# Patient Record
Sex: Male | Born: 2000 | Race: White | Hispanic: No | Smoking: Never smoker
Health system: Southern US, Community
[De-identification: ages and names within clinical notes are randomized; demographics above are authoritative.]

---
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
## 2021-11-05 HISTORY — PX: WISDOM TOOTH EXTRACTION: SHX21

## 2021-12-01 ENCOUNTER — Other Ambulatory Visit: Payer: Self-pay

## 2021-12-01 ENCOUNTER — Encounter (HOSPITAL_BASED_OUTPATIENT_CLINIC_OR_DEPARTMENT_OTHER): Payer: Self-pay

## 2021-12-01 ENCOUNTER — Other Ambulatory Visit (HOSPITAL_BASED_OUTPATIENT_CLINIC_OR_DEPARTMENT_OTHER): Payer: Self-pay

## 2021-12-01 ENCOUNTER — Emergency Department (HOSPITAL_BASED_OUTPATIENT_CLINIC_OR_DEPARTMENT_OTHER)
Admission: EM | Admit: 2021-12-01 | Discharge: 2021-12-01 | Disposition: A | Discharge status: Home / Self Care | Payer: Commercial Managed Care - PPO | Attending: Emergency Medicine | Admitting: Emergency Medicine

## 2021-12-01 DIAGNOSIS — M79662 Pain in left lower leg: Secondary | ICD-10-CM | POA: Diagnosis not present

## 2021-12-01 DIAGNOSIS — M79605 Pain in left leg: Secondary | ICD-10-CM

## 2021-12-01 DIAGNOSIS — Y92410 Unspecified street and highway as the place of occurrence of the external cause: Secondary | ICD-10-CM | POA: Diagnosis not present

## 2021-12-01 DIAGNOSIS — M542 Cervicalgia: Secondary | ICD-10-CM

## 2021-12-01 MED ORDER — METHOCARBAMOL 500 MG PO TABS
500.0000 mg | ORAL_TABLET | Freq: Three times a day (TID) | ORAL | 0 refills | Status: DC | PRN
  Filled 2021-12-01: qty 20, 7d supply, fill #0

## 2021-12-01 MED ORDER — LIDOCAINE 5 % EX PTCH
1.0000 | MEDICATED_PATCH | CUTANEOUS | 0 refills | Status: DC
  Filled 2021-12-01: qty 30, 30d supply, fill #0
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
## 2022-05-07 ENCOUNTER — Ambulatory Visit: Payer: Self-pay | Admitting: *Deleted

## 2022-05-07 NOTE — Telephone Encounter (Signed)
  Chief Complaint: cough- productive Symptoms: cough, wheezing with deep inhale Frequency: 6 days Pertinent Negatives: Patient denies runny nose, wheezing, chest pain Disposition: [] ED /[x] Urgent Care (no appt availability in office) / [] Appointment(In office/virtual)/ []  [facility]/ [] Home Care/ [] Refused Recommended Disposition /[] [facility]/ []  Follow-up with PCP Additional Notes: Recent travel- out of country, patient reports negative COVID test. No PCP- advised UC for acute symptoms- encouraged establish care

## 2022-05-07 NOTE — Telephone Encounter (Signed)
Summary: cough   The patient has experienced a cough for roughly a week   The patient was previously taking cough drops but shares that they have become ineffective   The patient was calling [facility] to schedule a same day appointment but is not currently a patient   The patient would like to speak with a member of staff further when possible      Reason for Disposition  Wheezing is present  Answer Assessment - Initial Assessment Questions 1. ONSET: "When did the cough begin?"      6 days 2. SEVERITY: "How bad is the cough today?"      wheezing 3. SPUTUM: "Describe the color of your sputum" (none, dry cough; clear, white, yellow, green)     Thick- clear/mucus 4. HEMOPTYSIS: "Are you coughing up any blood?" If so ask: "How much?" (flecks, streaks, tablespoons, etc.)     no 5. DIFFICULTY BREATHING: "Are you having difficulty breathing?" If Yes, ask: "How bad is it?" (e.g., mild, moderate, severe)    - MILD: No SOB at rest, mild SOB with walking, speaks normally in sentences, can lie down, no retractions, pulse < 100.    - MODERATE: SOB at rest, SOB with minimal exertion and prefers to sit, cannot lie down flat, speaks in phrases, mild retractions, audible wheezing, pulse 100-120.    - SEVERE: Very SOB at rest, speaks in single words, struggling to breathe, sitting hunched forward, retractions, pulse > 120      No- wheezing when breathing out 6. FEVER: "Do you have a fever?" If Yes, ask: "What is your temperature, how was it measured, and when did it start?"     no 7. CARDIAC HISTORY: "Do you have any history of heart disease?" (e.g., heart attack, congestive heart failure)      no 8. LUNG HISTORY: "Do you have any history of lung disease?"  (e.g., pulmonary embolus, asthma, emphysema)     no 9. PE RISK FACTORS: "Do you have a history of blood clots?" (or: recent major surgery, recent prolonged travel, bedridden)     no 10. OTHER SYMPTOMS: "Do you have any other symptoms?" (e.g., runny  nose, wheezing, chest pain)       wheezing 11. PREGNANCY: "Is there any chance you are pregnant?" "When was your last menstrual period?"         12. TRAVEL: "Have you traveled out of the country in the last month?" (e.g., travel history, exposures)       Recent travel  Protocols used: Cough - Acute Productive-A-AH

## 2023-01-26 ENCOUNTER — Encounter: Payer: Self-pay | Admitting: Family Medicine

## 2023-01-26 ENCOUNTER — Ambulatory Visit (INDEPENDENT_AMBULATORY_CARE_PROVIDER_SITE_OTHER): Payer: Commercial Managed Care - PPO | Admitting: Family Medicine

## 2023-01-26 VITALS — BP 124/78 | HR 75 | Temp 97.8°F | Ht 65.5 in | Wt 185.0 lb

## 2023-01-26 DIAGNOSIS — E669 Obesity, unspecified: Secondary | ICD-10-CM

## 2023-01-26 DIAGNOSIS — F419 Anxiety disorder, unspecified: Secondary | ICD-10-CM | POA: Diagnosis not present

## 2023-01-26 MED ORDER — ESCITALOPRAM OXALATE 10 MG PO TABS: ORAL_TABLET | ORAL | 0 refills | Status: DC

## 2023-01-26 NOTE — Patient Instructions (Signed)
Start escitalopram as prescribed.

## 2023-01-26 NOTE — Progress Notes (Unsigned)
Assessment/Plan:   Problem List Items Addressed This Visit   None Visit Diagnoses     Anxiety    -  Primary   Relevant Medications   escitalopram (LEXAPRO) 10 MG tablet   Class 1 obesity without serious comorbidity in adult, unspecified BMI, unspecified obesity type           Medications Discontinued During This Encounter  Medication Reason   lidocaine (LIDODERM) 5 %    methocarbamol (ROBAXIN) 500 MG tablet     Return in about 4 weeks (around 02/23/2023) for physical (fasting labs), anxiety.    Subjective:   Encounter date: 01/26/2023  Frank Yang is a 22 y.o. male who does not have a problem list on file.Marland Kitchen   He  has no past medical history on file.Marland Kitchen   He presents with chief complaint of Establish Care (Anxiety concerns) .   HPI:   Anxiety: Patient complains of {anxdep:15361}.  He has the following symptoms: {anxiety sx:15334}. Onset of symptoms was approximately { 0-10:33138} {time units:11} ago, {clinical course - history:17::"unchanged"} since that time. He denies current suicidal and homicidal ideation. Family history significant for {fam hx:15335}.Possible organic causes contributing are: {possible organic causes:15339}. Risk factors: {depression risk factors:1001} Previous treatment includes {anxiety treatments:15336} and {depression treatment:1010}.  He complains of the following side effects from the treatment: {side effects:15372}.   Review of Systems  Constitutional:  Negative for chills, diaphoresis, fever, malaise/fatigue and weight loss.  HENT:  Negative for congestion, ear discharge, ear pain and hearing loss.   Eyes:  Negative for blurred vision, double vision, photophobia, pain, discharge and redness.  Respiratory:  Negative for cough, sputum production, shortness of breath and wheezing.   Cardiovascular:  Negative for chest pain and palpitations.  Gastrointestinal:  Negative for abdominal pain, blood in stool, constipation, diarrhea, heartburn, melena,  nausea and vomiting.  Genitourinary:  Negative for dysuria, flank pain, frequency, hematuria and urgency.  Musculoskeletal:  Negative for myalgias.  Skin:  Negative for itching and rash.  Neurological:  Negative for dizziness, tingling, tremors, speech change, seizures, loss of consciousness, weakness and headaches.  Psychiatric/Behavioral:  Negative for depression, hallucinations, memory loss and suicidal ideas. The patient is nervous/anxious. The patient does not have insomnia.      Past Surgical History:  Procedure Laterality Date   WISDOM TOOTH EXTRACTION Bilateral 11/2021    Outpatient Medications Prior to Visit  Medication Sig Dispense Refill   Acetaminophen (TYLENOL PO) Take by mouth.     lidocaine (LIDODERM) 5 % Place 1 patch onto the skin daily. Remove & Discard patch within 12 hours or as directed by MD (Patient not taking: Reported on 01/26/2023) 30 patch 0   methocarbamol (ROBAXIN) 500 MG tablet Take 1 tablet (500 mg total) by mouth every 8 (eight) hours as needed for muscle spasms. (Patient not taking: Reported on 01/26/2023) 20 tablet 0   No facility-administered medications prior to visit.    Family History  Problem Relation Age of Onset   Anxiety disorder Mother    Gestational diabetes Mother    Depression Father    Anxiety disorder Father    Hypertension Father    Diabetes Father    Alcoholism Father    Diabetes Maternal Grandfather    Anxiety disorder Paternal Grandmother    Heart disease Paternal Grandmother    Diabetes Paternal Grandmother     Social History   Socioeconomic History   Marital status: Significant Other    Spouse name: Not on file   Number  of children: Not on file   Years of education: Not on file   Highest education level: Not on file  Occupational History   Occupation: Financial controller  Tobacco Use   Smoking status: Never    Passive exposure: Never   Smokeless tobacco: Never  Vaping Use   Vaping Use: Never used  Substance  and Sexual Activity   Alcohol use: Yes    Alcohol/week: 4.0 standard drinks of alcohol    Types: 4 Cans of beer per week   Drug use: Not Currently    Types: Cocaine, Marijuana, Psilocybin    Comment: LSD   Sexual activity: Not on file  Other Topics Concern   Not on file  Social History Narrative   Not on file   Social Determinants of Health   Financial Resource Strain: Not on file  Food Insecurity: Not on file  Transportation Needs: Not on file  Physical Activity: Not on file  Stress: Not on file  Social Connections: Not on file  Intimate Partner Violence: Not on file                                                                                                  Objective:  Physical Exam: BP 124/78 (BP Location: Left Arm, Patient Position: Sitting, Cuff Size: Large)   Pulse 75   Temp 97.8 F (36.6 C) (Temporal)   Ht 5' 5.5" (1.664 m)   Wt 185 lb (83.9 kg)   SpO2 99%   BMI 30.32 kg/m     Physical Exam Constitutional:      Appearance: Normal appearance.  HENT:     Head: Normocephalic and atraumatic.     Right Ear: Hearing normal.     Left Ear: Hearing normal.     Nose: Nose normal.  Eyes:     General: No scleral icterus.       Right eye: No discharge.        Left eye: No discharge.     Extraocular Movements: Extraocular movements intact.  Cardiovascular:     Rate and Rhythm: Normal rate and regular rhythm.     Heart sounds: Normal heart sounds.  Pulmonary:     Effort: Pulmonary effort is normal.     Breath sounds: Normal breath sounds.  Abdominal:     Palpations: Abdomen is soft.     Tenderness: There is no abdominal tenderness.  Skin:    General: Skin is warm.     Findings: No rash.  Neurological:     General: No focal deficit present.     Mental Status: He is alert.     Cranial Nerves: No cranial nerve deficit.  Psychiatric:        Mood and Affect: Mood normal.        Behavior: Behavior normal.        Thought Content: Thought content normal.         Judgment: Judgment normal.     No results found.  No results found for this or any previous visit (from the past 2160 hour(s)).  Alesia Banda, MD, MS

## 2023-01-28 ENCOUNTER — Encounter: Payer: Self-pay | Admitting: Family Medicine

## 2023-01-28 DIAGNOSIS — E66811 Obesity, class 1: Secondary | ICD-10-CM | POA: Insufficient documentation

## 2023-01-28 DIAGNOSIS — E669 Obesity, unspecified: Secondary | ICD-10-CM | POA: Insufficient documentation

## 2023-01-28 DIAGNOSIS — F419 Anxiety disorder, unspecified: Secondary | ICD-10-CM | POA: Insufficient documentation

## 2023-01-28 NOTE — Assessment & Plan Note (Signed)
The patient presents with anxiety that he reports has been ongoing for the last 3 years. Family history is positive for anxiety.   Plan:  Start escitalopram (LEXAPRO) 5 mg tablet to be taken orally once daily for two weeks, then increase to 10 mg if well tolerated. Follow-up visit in 4 weeks for evaluation of anxiety symptoms, effectiveness of medication, and physical examination with fasting labs. Emphasize the benefits of continued engagement with regular exercise and healthy lifestyle habits.

## 2023-02-22 ENCOUNTER — Other Ambulatory Visit: Payer: Self-pay | Admitting: Family Medicine

## 2023-02-22 DIAGNOSIS — F419 Anxiety disorder, unspecified: Secondary | ICD-10-CM

## 2023-02-23 ENCOUNTER — Telehealth: Payer: Self-pay | Admitting: Family Medicine

## 2023-02-23 DIAGNOSIS — F419 Anxiety disorder, unspecified: Secondary | ICD-10-CM

## 2023-02-23 NOTE — Telephone Encounter (Signed)
Prescription Request  02/23/2023  LOV: 01/26/2023  What is the name of the medication or equipment? escitalopram (LEXAPRO) 10 MG tablet [[id number]]   Have you contacted your pharmacy to request a refill? No    [facility] [address] [place], [place] - [address] AT [facility] [address] [place]. [place] [address] Phone: [phone] Fax: [phone]    Patient notified that their request is being sent to the clinical staff for review and that they should receive a response within 2 business days.   Please advise at Mobile [phone] (mobile) pt said he just need enough until his next appointment

## 2023-02-24 MED ORDER — ESCITALOPRAM OXALATE 10 MG PO TABS
ORAL_TABLET | ORAL | 0 refills | Status: DC
Start: 2023-02-24 — End: 2023-03-17

## 2023-02-24 NOTE — Telephone Encounter (Signed)
Left patient a detailed voice message regarding refill at [facility] and to call back with any concerns.

## 2023-02-24 NOTE — Telephone Encounter (Signed)
Chart supports rx. Last OV: 01/26/2023 Next OV: 03/17/2023

## 2023-02-25 ENCOUNTER — Encounter: Payer: Commercial Managed Care - PPO | Admitting: Family Medicine

## 2023-03-17 ENCOUNTER — Encounter: Payer: Self-pay | Admitting: Family Medicine

## 2023-03-17 ENCOUNTER — Ambulatory Visit (INDEPENDENT_AMBULATORY_CARE_PROVIDER_SITE_OTHER): Payer: Commercial Managed Care - PPO | Admitting: Family Medicine

## 2023-03-17 VITALS — BP 124/80 | HR 43 | Temp 97.6°F | Ht 65.5 in | Wt 187.0 lb

## 2023-03-17 DIAGNOSIS — G5600 Carpal tunnel syndrome, unspecified upper limb: Secondary | ICD-10-CM | POA: Diagnosis not present

## 2023-03-17 DIAGNOSIS — F419 Anxiety disorder, unspecified: Secondary | ICD-10-CM

## 2023-03-17 DIAGNOSIS — Z23 Encounter for immunization: Secondary | ICD-10-CM

## 2023-03-17 DIAGNOSIS — M545 Low back pain, unspecified: Secondary | ICD-10-CM | POA: Diagnosis not present

## 2023-03-17 DIAGNOSIS — E669 Obesity, unspecified: Secondary | ICD-10-CM | POA: Diagnosis not present

## 2023-03-17 DIAGNOSIS — Z0000 Encounter for general adult medical examination without abnormal findings: Secondary | ICD-10-CM | POA: Diagnosis not present

## 2023-03-17 DIAGNOSIS — Z1159 Encounter for screening for other viral diseases: Secondary | ICD-10-CM

## 2023-03-17 DIAGNOSIS — E782 Mixed hyperlipidemia: Secondary | ICD-10-CM

## 2023-03-17 LAB — COMPREHENSIVE METABOLIC PANEL
ALT: 31 U/L (ref 0–53)
AST: 23 U/L (ref 0–37)
Albumin: 4.9 g/dL (ref 3.5–5.2)
Alkaline Phosphatase: 55 U/L (ref 39–117)
BUN: 10 mg/dL (ref 6–23)
CO2: 28 mEq/L (ref 19–32)
Calcium: 9.7 mg/dL (ref 8.4–10.5)
Chloride: 103 mEq/L (ref 96–112)
Creatinine, Ser: 1.03 mg/dL (ref 0.40–1.50)
GFR: 102.09 mL/min (ref >60.00)
Glucose, Bld: 90 mg/dL (ref 70–99)
Potassium: 4.6 mEq/L (ref 3.5–5.1)
Sodium: 139 mEq/L (ref 135–145)
Total Bilirubin: 0.7 mg/dL (ref 0.2–1.2)
Total Protein: 7.3 g/dL (ref 6.0–8.3)

## 2023-03-17 LAB — LIPID PANEL
Cholesterol: 184 mg/dL (ref 0–200)
HDL: 45.8 mg/dL (ref >39.00)
LDL Cholesterol: 123 mg/dL — ABNORMAL HIGH (ref 0–99)
NonHDL: 137.94
Total CHOL/HDL Ratio: 4
Triglycerides: 76 mg/dL (ref 0.0–149.0)
VLDL: 15.2 mg/dL (ref 0.0–40.0)

## 2023-03-17 LAB — URINALYSIS, ROUTINE W REFLEX MICROSCOPIC
Bilirubin Urine: NEGATIVE
Hgb urine dipstick: NEGATIVE
Ketones, ur: NEGATIVE
Leukocytes,Ua: NEGATIVE
Nitrite: NEGATIVE
RBC / HPF: NONE SEEN (ref 0–?)
Specific Gravity, Urine: 1.01 (ref 1.000–1.030)
Total Protein, Urine: NEGATIVE
Urine Glucose: NEGATIVE
Urobilinogen, UA: 0.2 (ref 0.0–1.0)
pH: 6.5 (ref 5.0–8.0)

## 2023-03-17 LAB — CBC WITH DIFFERENTIAL/PLATELET
Basophils Absolute: 0 10*3/uL (ref 0.0–0.1)
Basophils Relative: 0.5 % (ref 0.0–3.0)
Eosinophils Absolute: 0.3 10*3/uL (ref 0.0–0.7)
Eosinophils Relative: 5 % (ref 0.0–5.0)
HCT: 49 % (ref 39.0–52.0)
Hemoglobin: 16.6 g/dL (ref 13.0–17.0)
Lymphocytes Relative: 32.7 % (ref 12.0–46.0)
Lymphs Abs: 1.8 10*3/uL (ref 0.7–4.0)
MCHC: 34 g/dL (ref 30.0–36.0)
MCV: 91.3 fl (ref 78.0–100.0)
Monocytes Absolute: 0.3 10*3/uL (ref 0.1–1.0)
Monocytes Relative: 5.5 % (ref 3.0–12.0)
Neutro Abs: 3.2 10*3/uL (ref 1.4–7.7)
Neutrophils Relative %: 56.3 % (ref 43.0–77.0)
Platelets: 249 10*3/uL (ref 150.0–400.0)
RBC: 5.37 Mil/uL (ref 4.22–5.81)
RDW: 13.1 % (ref 11.5–15.5)
WBC: 5.6 10*3/uL (ref 4.0–10.5)

## 2023-03-17 LAB — TSH: TSH: 1.1 u[IU]/mL (ref 0.35–5.50)

## 2023-03-17 LAB — MICROALBUMIN / CREATININE URINE RATIO
Creatinine,U: 47.3 mg/dL
Microalb Creat Ratio: 1.5 mg/g (ref 0.0–30.0)
Microalb, Ur: <0.7 mg/dL (ref 0.0–1.9)

## 2023-03-17 LAB — HEMOGLOBIN A1C: Hgb A1c MFr Bld: 4.7 % (ref 4.6–6.5)

## 2023-03-17 MED ORDER — ESCITALOPRAM OXALATE 10 MG PO TABS
15.0000 mg | ORAL_TABLET | Freq: Every day | ORAL | 0 refills | Status: DC
Start: 2023-03-17 — End: 2023-12-13

## 2023-03-17 MED ORDER — METHOCARBAMOL 750 MG PO TABS
750.0000 mg | ORAL_TABLET | Freq: Three times a day (TID) | ORAL | 0 refills | Status: AC | PRN
Start: 2023-03-17 — End: 2023-04-16

## 2023-03-17 NOTE — Progress Notes (Signed)
Assessment  Assessment/Plan:   Problem List Items Addressed This Visit       Nervous and Auditory   Carpal tunnel syndrome    Continue conservative management with ergonomic adjustments and wrist splints as needed.      Relevant Medications   escitalopram (LEXAPRO) 10 MG tablet   methocarbamol (ROBAXIN-750) 750 MG tablet     Other   Anxiety    Increase escitalopram dose to 15 mg daily. Encouraged to explore therapy options available through his employer. Discussed the importance of lifestyle modifications, including exercise, diet, and stress management techniques.      Relevant Medications   escitalopram (LEXAPRO) 10 MG tablet   Other Relevant Orders   TSH (Completed)   Vitamin D 1,25 dihydroxy   CBC with Differential/Platelet (Completed)   Class 1 obesity without serious comorbidity in adult   Relevant Medications   escitalopram (LEXAPRO) 10 MG tablet   methocarbamol (ROBAXIN-750) 750 MG tablet   Other Relevant Orders   TSH (Completed)   Lipid panel (Completed)   Hemoglobin A1c (Completed)   Microalbumin / creatinine urine ratio (Completed)   Urinalysis, Routine w reflex microscopic (Completed)   Vitamin D 1,25 dihydroxy   CBC with Differential/Platelet (Completed)   Comprehensive metabolic panel (Completed)   Acute bilateral low back pain without sciatica - Primary    Prescribed Robaxin 750 mg, three times a day as needed for muscle spasms. Continue with conservative measures including stretching, rest, and the use of over-the-counter 4% lidocaine patches as needed.      Relevant Medications   methocarbamol (ROBAXIN-750) 750 MG tablet   Other Visit Diagnoses     Screening for viral disease       Relevant Orders   HCV Ab w Reflex to Quant PCR (Completed)   HIV Antibody (routine testing w rflx) (Completed)   Encounter for well adult exam without abnormal findings       Immunization due           Medications Discontinued During This Encounter   Medication Reason   escitalopram (LEXAPRO) 10 MG tablet    escitalopram (LEXAPRO) 10 MG tablet     Patient Counseling(The following topics were reviewed and/or handout was given):  -Nutrition: Stressed importance of moderation in sodium/caffeine intake, saturated fat and cholesterol, caloric balance, sufficient intake of fresh fruits, vegetables, and fiber.  -Stressed the importance of regular exercise.   -Substance Abuse: Discussed cessation/primary prevention of tobacco, alcohol, or other drug use; driving or other dangerous activities under the influence; availability of treatment for abuse.   -Injury prevention: Discussed safety belts, safety helmets, smoke detector, smoking near bedding or upholstery.   -Sexuality: Discussed sexually transmitted diseases, partner selection, use of condoms, avoidance of unintended pregnancy and contraceptive alternatives.   -Dental health: Discussed importance of regular tooth brushing, flossing, and dental visits.  -Health maintenance and immunizations reviewed. Please refer to Health maintenance section.  Return to care in 1 year for next preventative visit.       Subjective:  Chief complaint Encounter date: 03/17/2023  Chief Complaint  Patient presents with   Annual Exam    Fasting   Frank Yang is a 22 y.o. male who presents today for his annual comprehensive physical exam.    HISTORY OF PRESENT ILLNESS:  Carpal Tunnel Syndrome - Patient complains of carpal tunnel syndrome, attributing it to excessive computer use. Reports symptoms have not worsened.  Anxiety - Patient reports continuing episodes of anxiety, primarily an overwhelming sense of impending  danger, especially related to work. Currently on escitalopram 10 mg daily, which has shown some mild improvements in managing symptoms. No major side effects reported, though a slight increase in appetite has been noted. Patient is open to increasing the dose to 15 mg for potentially better  control.  Back Spasms - Recent onset of back spasms following a minor injury lifting bags at work, leading to mild discomfort and occasional difficulty in movement. Bruising was noted by the patient on the right side where discomfort mainly resides. Condition is improving with rest, stretching, and elevating his legs. No urinary symptoms, fever, chills, diarrhea, constipation, or significant abdominal pain reported.  Lifestyle:  Diet: Good. Balanced with an intake of fruits, greens, and occasional fast food. Exercise: Engages in regular exercise, mainly cycling on a Peloton bike. Recent injuries have temporarily paused activities.  Review of Systems  Constitutional:  Negative for chills, diaphoresis, fever, malaise/fatigue and weight loss.  HENT:  Negative for congestion, ear discharge, ear pain and hearing loss.   Eyes:  Negative for blurred vision, double vision, photophobia, pain, discharge and redness.  Respiratory:  Negative for cough, sputum production, shortness of breath and wheezing.   Cardiovascular:  Negative for chest pain and palpitations.  Gastrointestinal:  Negative for abdominal pain, blood in stool, constipation, diarrhea, heartburn, melena, nausea and vomiting.  Genitourinary:  Negative for dysuria, flank pain, frequency, hematuria and urgency.  Musculoskeletal:  Positive for back pain. Negative for myalgias.  Skin:  Negative for itching and rash.  Neurological:  Positive for sensory change (in hands). Negative for dizziness, tingling, tremors, speech change, seizures, loss of consciousness, weakness and headaches.  Psychiatric/Behavioral:  Negative for depression, hallucinations, memory loss, substance abuse and suicidal ideas. The patient is nervous/anxious. The patient does not have insomnia.   All other systems reviewed and are negative.      03/17/2023   11:03 AM 01/26/2023    2:30 PM  GAD-7 Generalized Anxiety Disorder Screening Tool  1. Feeling Nervous, Anxious, or  on Edge 2 2  2. Not Being Able to Stop or Control Worrying 1 1  3. Worrying Too Much About Different Things 2 2  4. Trouble Relaxing 1 2  5. Being So Restless it's Hard To Sit Still 2 0  6. Becoming Easily Annoyed or Irritable 2 1  7. Feeling Afraid As If Something Awful Might Happen 2 3  Total GAD-7 Score 12 11  Difficulty At Work, Home, or Getting  Along With Others? Very difficult Very difficult      03/17/2023   11:03 AM 01/26/2023    2:30 PM  Depression screen PHQ 2/9  Decreased Interest 1 1  Down, Depressed, Hopeless 0 0  PHQ - 2 Score 1 1  Altered sleeping 2 1  Tired, decreased energy 1 1  Change in appetite 0 0  Feeling bad or failure about yourself  0 0  Trouble concentrating 0 1  Moving slowly or fidgety/restless 0 0  Suicidal thoughts 0 0  PHQ-9 Score 4 4  Difficult doing work/chores Somewhat difficult Somewhat difficult    There are no preventive care reminders to display for this patient.   PMH:  The following were reviewed and entered/updated in epic: No past medical history on file.  Patient Active Problem List   Diagnosis Date Noted   Acute bilateral low back pain without sciatica 03/19/2023   Carpal tunnel syndrome 03/19/2023   Anxiety 01/28/2023   Class 1 obesity without serious comorbidity in adult 01/28/2023  Past Surgical History:  Procedure Laterality Date   WISDOM TOOTH EXTRACTION Bilateral 11/2021    Family History  Problem Relation Age of Onset   Anxiety disorder Mother    Gestational diabetes Mother    Depression Father    Anxiety disorder Father    Hypertension Father    Diabetes Father    Alcoholism Father    Diabetes Maternal Grandfather    Anxiety disorder Paternal Grandmother    Heart disease Paternal Grandmother    Diabetes Paternal Grandmother     Medications- reviewed and updated Outpatient Medications Prior to Visit  Medication Sig Dispense Refill   Acetaminophen (TYLENOL PO) Take by mouth.     escitalopram  (LEXAPRO) 10 MG tablet TAKE ONE-HALF TABLET BY MOUTH ONE TIME DAILY FOR 14 DAYS, THEN TAKE ONE TABLET BY MOUTH ONE TIME DAILY FOR 16 DAYS 23 tablet 0   escitalopram (LEXAPRO) 10 MG tablet Take 0.5 tablets (5 mg total) by mouth daily for 14 days, THEN 1 tablet (10 mg total) daily for 16 days. (Patient not taking: Reported on 03/17/2023) 23 tablet 0   No facility-administered medications prior to visit.    No Known Allergies  Social History   Socioeconomic History   Marital status: Significant Other    Spouse name: Not on file   Number of children: Not on file   Years of education: Not on file   Highest education level: Not on file  Occupational History   Occupation: Financial controller  Tobacco Use   Smoking status: Never    Passive exposure: Never   Smokeless tobacco: Never  Vaping Use   Vaping Use: Never used  Substance and Sexual Activity   Alcohol use: Yes    Alcohol/week: 4.0 standard drinks of alcohol    Types: 4 Cans of beer per week   Drug use: Not Currently    Types: Cocaine, Marijuana, Psilocybin    Comment: LSD   Sexual activity: Not on file  Other Topics Concern   Not on file  Social History Narrative   Exercise: Engages with [employer] 4-5 times a week.   Social Determinants of Health   Financial Resource Strain: Not on file  Food Insecurity: Not on file  Transportation Needs: Not on file  Physical Activity: Not on file  Stress: Not on file  Social Connections: Not on file        Objective:  Physical Exam: BP 124/80 (BP Location: Left Arm, Patient Position: Sitting, Cuff Size: Large)   Pulse (!) 43   Temp 97.6 F (36.4 C) (Temporal)   Ht 5' 5.5" (1.664 m)   Wt 187 lb (84.8 kg)   SpO2 97%   BMI 30.65 kg/m   Body mass index is 30.65 kg/m. Wt Readings from Last 3 Encounters:  03/17/23 187 lb (84.8 kg)  01/26/23 185 lb (83.9 kg)  12/01/21 170 lb (77.1 kg)    Physical Exam Constitutional:      General: He is not in acute distress.     Appearance: Normal appearance. He is not ill-appearing or toxic-appearing.  HENT:     Head: Normocephalic and atraumatic.     Right Ear: Hearing, tympanic membrane, ear canal and external ear normal. There is no impacted cerumen.     Left Ear: Hearing, tympanic membrane, ear canal and external ear normal. There is no impacted cerumen.     Nose: Nose normal. No congestion.     Mouth/Throat:     Lips: No lesions.  Mouth: Mucous membranes are moist.     Pharynx: Oropharynx is clear. No oropharyngeal exudate.  Eyes:     General: No scleral icterus.       Right eye: No discharge.        Left eye: No discharge.     Conjunctiva/sclera: Conjunctivae normal.     Pupils: Pupils are equal, round, and reactive to light.  Neck:     Thyroid: No thyroid mass, thyromegaly or thyroid tenderness.  Cardiovascular:     Rate and Rhythm: Normal rate and regular rhythm.     Pulses: Normal pulses.     Heart sounds: Normal heart sounds.  Pulmonary:     Effort: Pulmonary effort is normal. No respiratory distress.     Breath sounds: Normal breath sounds.  Abdominal:     General: Abdomen is flat. Bowel sounds are normal.     Palpations: Abdomen is soft.  Musculoskeletal:     Cervical back: Normal range of motion.     Lumbar back: Spasms and tenderness (right lower back) present. Decreased range of motion (mild bending forward due to pain).     Right lower leg: No edema.     Left lower leg: No edema.  Lymphadenopathy:     Cervical: No cervical adenopathy.  Skin:    General: Skin is warm and dry.     Findings: No rash.  Neurological:     General: No focal deficit present.     Mental Status: He is alert and oriented to person, place, and time. Mental status is at baseline.     Deep Tendon Reflexes:     Reflex Scores:      Patellar reflexes are 2+ on the right side and 2+ on the left side. Psychiatric:        Mood and Affect: Mood normal.        Behavior: Behavior normal.        Thought Content:  Thought content normal.        Judgment: Judgment normal.         At today's visit, we discussed treatment options, associated risk and benefits, and engage in counseling as needed.  Additionally the following were reviewed: Past medical records, past medical and surgical history, family and social background, as well as relevant laboratory results, imaging findings, and specialty notes, where applicable.  This message was generated using dictation software, and as a result, it may contain unintentional typos or errors.  Nevertheless, extensive effort was made to accurately convey at the pertinent aspects of the patient visit.    There may have been are other unrelated non-urgent complaints, but due to the busy schedule and the amount of time already spent with him, time does not permit to address these issues at today's visit. Another appointment may have or has been requested to review these additional issues.   Thomes Dinning, MD, MS

## 2023-03-17 NOTE — Patient Instructions (Signed)
Increase escitalopram to 15 mg as discussed.

## 2023-03-18 LAB — HCV INTERPRETATION

## 2023-03-18 LAB — HCV AB W REFLEX TO QUANT PCR: HCV Ab: NONREACTIVE

## 2023-03-19 DIAGNOSIS — G5600 Carpal tunnel syndrome, unspecified upper limb: Secondary | ICD-10-CM | POA: Insufficient documentation

## 2023-03-19 DIAGNOSIS — M545 Low back pain, unspecified: Secondary | ICD-10-CM | POA: Insufficient documentation

## 2023-03-19 NOTE — Assessment & Plan Note (Signed)
Continue conservative management with ergonomic adjustments and wrist splints as needed.

## 2023-03-19 NOTE — Assessment & Plan Note (Signed)
Prescribed Robaxin 750 mg, three times a day as needed for muscle spasms. Continue with conservative measures including stretching, rest, and the use of over-the-counter 4% lidocaine patches as needed.

## 2023-03-19 NOTE — Assessment & Plan Note (Signed)
Increase escitalopram dose to 15 mg daily. Encouraged to explore therapy options available through his employer. Discussed the importance of lifestyle modifications, including exercise, diet, and stress management techniques.

## 2023-03-20 LAB — VITAMIN D 1,25 DIHYDROXY
Vitamin D 1, 25 (OH)2 Total: 27 pg/mL (ref 18–72)
Vitamin D2 1, 25 (OH)2: <8 pg/mL
Vitamin D3 1, 25 (OH)2: 27 pg/mL

## 2023-03-20 LAB — HIV ANTIBODY (ROUTINE TESTING W REFLEX): HIV 1&2 Ab, 4th Generation: NONREACTIVE

## 2023-12-08 ENCOUNTER — Telehealth: Payer: Self-pay

## 2023-12-08 NOTE — Telephone Encounter (Signed)
 Received forms from [employer] for patient via fax for FMLA. Advised patient via mychart that an appointment is needed to discuss his needs for leave. Form placed on my desk in my MISC. [employer] folder.

## 2023-12-13 ENCOUNTER — Encounter: Payer: Self-pay | Admitting: Family Medicine

## 2023-12-13 ENCOUNTER — Ambulatory Visit (INDEPENDENT_AMBULATORY_CARE_PROVIDER_SITE_OTHER): Admitting: Family Medicine

## 2023-12-13 VITALS — BP 126/76 | HR 70 | Temp 97.7°F | Wt 196.8 lb

## 2023-12-13 DIAGNOSIS — Z23 Encounter for immunization: Secondary | ICD-10-CM | POA: Diagnosis not present

## 2023-12-13 DIAGNOSIS — F419 Anxiety disorder, unspecified: Secondary | ICD-10-CM | POA: Diagnosis not present

## 2023-12-13 DIAGNOSIS — Z7184 Encounter for health counseling related to travel: Secondary | ICD-10-CM | POA: Diagnosis not present

## 2023-12-13 NOTE — Progress Notes (Unsigned)
 Assessment/Plan:   Problem List Items Addressed This Visit   None Visit Diagnoses       Immunization due    -  Primary   Relevant Orders   Flu vaccine trivalent PF, 6mos and older(Flulaval,Afluria,Fluarix,Fluzone) (Completed)       There are no discontinued medications.  No follow-ups on file.    Subjective:   Encounter date: 12/13/2023  Frank Yang is a 23 y.o. male who has Anxiety; Class 1 obesity without serious comorbidity in adult; Acute bilateral low back pain without sciatica; and Carpal tunnel syndrome on their problem list..   He  has no past medical history on file.Marland Kitchen   He presents with chief complaint of Medical Management of Chronic Issues (Anxiety/ FMLA forms. Feeling tense with back spasms due to anxiety. Trip to [place] would like to discuss prebiotic to prep gut for trip.  Epi pen inquiry. ) .     12/13/2023   11:15 AM 03/17/2023   11:03 AM 01/26/2023    2:30 PM  Depression screen PHQ 2/9  Decreased Interest 1 1 1   Down, Depressed, Hopeless 2 0 0  PHQ - 2 Score 3 1 1   Altered sleeping 3 2 1   Tired, decreased energy 2 1 1   Change in appetite 1 0 0  Feeling bad or failure about yourself  2 0 0  Trouble concentrating 3 0 1  Moving slowly or fidgety/restless 3 0 0  Suicidal thoughts 0 0 0  PHQ-9 Score 17 4 4   Difficult doing work/chores Extremely dIfficult Somewhat difficult Somewhat difficult      12/13/2023   11:15 AM 03/17/2023   11:03 AM 01/26/2023    2:30 PM  GAD 7 : Generalized Anxiety Score  Nervous, Anxious, on Edge 3 2 2   Control/stop worrying 3 1 1   Worry too much - different things 3 2 2   Trouble relaxing 3 1 2   Restless 2 2 0  Easily annoyed or irritable 3 2 1   Afraid - awful might happen 2 2 3   Total GAD 7 Score 19 12 11   Anxiety Difficulty Extremely difficult Very difficult Very difficult     HPI:   ROS  Past Surgical History:  Procedure Laterality Date   WISDOM TOOTH EXTRACTION Bilateral 11/2021    Outpatient  Medications Prior to Visit  Medication Sig Dispense Refill   Acetaminophen (TYLENOL PO) Take by mouth.     escitalopram (LEXAPRO) 10 MG tablet Take 1.5 tablets (15 mg total) by mouth daily. 135 tablet 0   No facility-administered medications prior to visit.    Family History  Problem Relation Age of Onset   Anxiety disorder Mother    Gestational diabetes Mother    Depression Father    Anxiety disorder Father    Hypertension Father    Diabetes Father    Alcoholism Father    Diabetes Maternal Grandfather    Anxiety disorder Paternal Grandmother    Heart disease Paternal Grandmother    Diabetes Paternal Grandmother     Social History   Socioeconomic History   Marital status: Significant Other    Spouse name: Not on file   Number of children: Not on file   Years of education: Not on file   Highest education level: Some college, no degree  Occupational History   Occupation: Financial controller  Tobacco Use   Smoking status: Never    Passive exposure: Never   Smokeless tobacco: Never  Vaping Use   Vaping status: Never  Used  Substance and Sexual Activity   Alcohol use: Yes    Alcohol/week: 4.0 standard drinks of alcohol    Types: 4 Cans of beer per week   Drug use: Not Currently    Types: Cocaine, Marijuana, Psilocybin    Comment: LSD   Sexual activity: Not on file  Other Topics Concern   Not on file  Social History Narrative   Exercise: Engages with [employer] 4-5 times a week.   Social Drivers of Health   Financial Resource Strain: Medium Risk (12/12/2023)   Overall Financial Resource Strain (CARDIA)    Difficulty of Paying Living Expenses: Somewhat hard  Food Insecurity: No Food Insecurity (12/12/2023)   Hunger Vital Sign    Worried About Running Out of Food in the Last Year: Never true    Ran Out of Food in the Last Year: Never true  Transportation Needs: No Transportation Needs (12/12/2023)   PRAPARE - Administrator, Civil Service (Medical): No     Lack of Transportation (Non-Medical): No  Physical Activity: Sufficiently Active (12/12/2023)   Exercise Vital Sign    Days of Exercise per Week: 4 days    Minutes of Exercise per Session: 60 min  Stress: Stress Concern Present (12/12/2023)   [employer] of Occupational Health - Occupational Stress Questionnaire    Feeling of Stress : Very much  Social Connections: Moderately Isolated (12/12/2023)   Social Connection and Isolation Panel [NHANES]    Frequency of Communication with Friends and Family: More than three times a week    Frequency of Social Gatherings with Friends and Family: Twice a week    Attends Religious Services: Never    Database administrator or Organizations: No    Attends Engineer, structural: Not on file    Marital Status: Living with partner  Intimate Partner Violence: Unknown (01/08/2022)   Received from [employer], [facility]   HITS    Physically Hurt: Not on file    Insult or Talk Down To: Not on file    Threaten Physical Harm: Not on file    Scream or Curse: Not on file                                                                                                  Objective:  Physical Exam: BP 126/76   Pulse 70   Temp 97.7 F (36.5 C) (Temporal)   Wt 196 lb 12.8 oz (89.3 kg)   SpO2 99%   BMI 32.25 kg/m     Physical Exam  No results found.  No results found for this or any previous visit (from the past 2160 hours).      Garner Nash, MD, MS

## 2023-12-14 ENCOUNTER — Encounter: Payer: Self-pay | Admitting: Family Medicine

## 2023-12-14 ENCOUNTER — Ambulatory Visit: Admitting: Family Medicine

## 2023-12-15 ENCOUNTER — Telehealth: Payer: Self-pay

## 2023-12-15 NOTE — Telephone Encounter (Signed)
 [place] [place] forms faxed successfully to [phone]. Copy placed in scan folder. Pt notified on mychart thread.

## 2023-12-16 ENCOUNTER — Telehealth: Payer: Self-pay

## 2023-12-16 NOTE — Telephone Encounter (Unsigned)
 Copied from CRM [id number]. Topic: General - Other >> Dec 16, 2023 11:47 AM Eunice Blase wrote: Reason for CRM: Received call from [facility] per [facility] ph: [phone] ext. [phone], fax: [phone]. Please make corrections to [facility] forms by drawing line through incorrect information and initial and date. Do write in correct information on Part D, Question - 3, start date of 12/05/2023 and Section E, Question - 2 there was no time provided for doctor's visits. Please call Byrd Hesselbach [phone] if further instructions are needed.

## 2023-12-20 NOTE — Telephone Encounter (Signed)
 Form was updated and faxed on 12/17/2023 per stamp on paperwork. Form placed in scan folder.

## 2023-12-22 NOTE — Telephone Encounter (Signed)
 Frank Yang stated that she received paperwork that was faxed on 12/17/23 and didn't need any updates. The CRM was from 12/16/2023.

## 2023-12-22 NOTE — Telephone Encounter (Signed)
 Placed form back in providers box for review. Please specify time in Section E #2.

## 2023-12-22 NOTE — Telephone Encounter (Signed)
 Copied from CRM [id number]. Topic: General - Other >> Dec 16, 2023 11:47 AM Eunice Blase wrote: Reason for CRM: Received call from [facility] per [facility] ph: [phone] ext. [phone], fax: [phone]. Please make corrections to [facility] forms by drawing line through incorrect information and initial and date. Do write in correct information on Part D, Question - 3, start date of 12/05/2023 and Section E, Question - 2 there was no time provided for doctor's visits. Please call Byrd Hesselbach [phone] if further instructions are needed.

## 2024-02-14 ENCOUNTER — Ambulatory Visit: Admitting: Family Medicine

## 2024-06-16 ENCOUNTER — Encounter: Payer: Self-pay | Admitting: Family Medicine

## 2024-06-16 ENCOUNTER — Telehealth: Payer: Self-pay | Admitting: Family Medicine

## 2024-06-16 ENCOUNTER — Telehealth: Admitting: Physician Assistant

## 2024-06-16 DIAGNOSIS — H00011 Hordeolum externum right upper eyelid: Secondary | ICD-10-CM | POA: Diagnosis not present

## 2024-06-16 MED ORDER — ERYTHROMYCIN 5 MG/GM OP OINT
1.0000 | TOPICAL_OINTMENT | Freq: Every day | OPHTHALMIC | 0 refills | Status: AC
Start: 2024-06-16 — End: 2024-06-21

## 2024-06-16 NOTE — Progress Notes (Signed)
  E-Visit for Stye   We are sorry that you are not feeling well. Here is how we plan to help!  Based on what you have shared with me it looks like you have a stye.  A stye is an inflammation of the eyelid.  It is often a red, painful lump near the edge of the eyelid that may look like a boil or a pimple.  A stye develops when an infection occurs at the base of an eyelash.   We have made appropriate suggestions for you based upon your presentation: Simple styes can be treated without medical intervention.  Most styes either resolve spontaneously or resolve with simple home treatment by applying warm compresses or heated washcloth to the stye for about 10-15 minutes three to four times a day. This causes the stye to drain and resolve. and I have prescribed Erythromycin Ophthalmic ointment 0.5% Apply topically in affected eye daily at bedtime for 5 days. To apply: Tilt the head back and, pressing your finger gently on the skin just beneath the lower eyelid, pull the lower eyelid away from the eye to make a space. Squeeze a thin strip of ointment into this space. A 1-cm (approximately 1/3-inch) strip of ointment is usually enough, unless you have been told by your doctor to use a different amount. Let go of the eyelid and gently close the eyes. Keep the eyes closed for 1 or 2 minutes to allow the medicine to come into contact with the infection.      HOME CARE:  Wash your hands often! Let the stye open on its own. Don't squeeze or open it. Don't rub your eyes. This can irritate your eyes and let in bacteria.  If you need to touch your eyes, wash your hands first. Don't wear eye makeup or contact lenses until the area has healed.  GET HELP RIGHT AWAY IF:  Your symptoms do not improve. You develop blurred or loss of vision. Your symptoms worsen (increased discharge, pain or redness).   Thank you for choosing an e-visit.  Your e-visit answers were reviewed by a board certified advanced clinical  practitioner to complete your personal care plan. Depending upon the condition, your plan could have included both over the counter or prescription medications.  Please review your pharmacy choice. Make sure the pharmacy is open so you can pick up prescription now. If there is a problem, you may contact your provider through [employer] and have the prescription routed to another pharmacy.  Your safety is important to us . If you have drug allergies check your prescription carefully.   For the next 24 hours you can use MyChart to ask questions about today's visit, request a non-urgent call back, or ask for a work or school excuse. You will get an email in the next two days asking about your experience. I hope that your e-visit has been valuable and will speed your recovery.    I have spent 5 minutes in review of e-visit questionnaire, review and updating patient chart, medical decision making and response to patient.   Delon CHRISTELLA Dickinson, PA-C
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
## 2024-08-23 ENCOUNTER — Telehealth: Admitting: Physician Assistant

## 2024-08-23 DIAGNOSIS — B9689 Other specified bacterial agents as the cause of diseases classified elsewhere: Secondary | ICD-10-CM

## 2024-08-23 DIAGNOSIS — J208 Acute bronchitis due to other specified organisms: Secondary | ICD-10-CM

## 2024-08-23 MED ORDER — ALBUTEROL SULFATE HFA 108 (90 BASE) MCG/ACT IN AERS: 1.0000 | INHALATION_SPRAY | Freq: Four times a day (QID) | RESPIRATORY_TRACT | 0 refills | Status: AC | PRN

## 2024-08-23 MED ORDER — AZITHROMYCIN 250 MG PO TABS
ORAL_TABLET | ORAL | 0 refills | Status: AC
Start: 2024-08-23 — End: 2024-08-28

## 2024-08-23 MED ORDER — BENZONATATE 100 MG PO CAPS
100.0000 mg | ORAL_CAPSULE | Freq: Three times a day (TID) | ORAL | 0 refills | Status: AC | PRN
Start: 2024-08-23 — End: ?

## 2024-08-23 NOTE — Progress Notes (Signed)
 We are sorry that you are not feeling well.  Here is how we plan to help!  Based on your presentation I believe you most likely have A cough due to bacteria.  When patients have a fever and a productive cough with a change in color or increased sputum production, we are concerned about bacterial bronchitis.  If left untreated it can progress to pneumonia.  If your symptoms do not improve with your treatment plan it is important that you contact your provider.   I have prescribed Azithromyin 250 mg: two tablets now and then one tablet daily for 4 additonal days    In addition you may use A non-prescription cough medication called Mucinex DM: take 2 tablets every 12 hours. and A prescription cough medication called Tessalon Perles 100mg . You may take 1-2 capsules every 8 hours as needed for your cough.  I have also prescribed Albuterol inhaler Use 1-2 puffs every 6 hours as needed for shortness of breath, chest tightness, and/or wheezing.  From your responses in the eVisit questionnaire you describe inflammation in the upper respiratory tract which is causing a significant cough.  This is commonly called Bronchitis and has four common causes:   Allergies Viral Infections Acid Reflux Bacterial Infection Allergies, viruses and acid reflux are treated by controlling symptoms or eliminating the cause. An example might be a cough caused by taking certain blood pressure medications. You stop the cough by changing the medication. Another example might be a cough caused by acid reflux. Controlling the reflux helps control the cough.  USE OF BRONCHODILATOR (RESCUE) INHALERS: There is a risk from using your bronchodilator too frequently.  The risk is that over-reliance on a medication which only relaxes the muscles surrounding the breathing tubes can reduce the effectiveness of medications prescribed to reduce swelling and congestion of the tubes themselves.  Although you feel brief relief from the  bronchodilator inhaler, your asthma may actually be worsening with the tubes becoming more swollen and filled with mucus.  This can delay other crucial treatments, such as oral steroid medications. If you need to use a bronchodilator inhaler daily, several times per day, you should discuss this with your provider.  There are probably better treatments that could be used to keep your asthma under control.     HOME CARE Only take medications as instructed by your medical team. Complete the entire course of an antibiotic. Drink plenty of fluids and get plenty of rest. Avoid close contacts especially the very young and the elderly Cover your mouth if you cough or cough into your sleeve. Always remember to wash your hands A steam or ultrasonic humidifier can help congestion.   GET HELP RIGHT AWAY IF: You develop worsening fever. You become short of breath You cough up blood. Your symptoms persist after you have completed your treatment plan MAKE SURE YOU  Understand these instructions. Will watch your condition. Will get help right away if you are not doing well or get worse.  Your e-visit answers were reviewed by a board certified advanced clinical practitioner to complete your personal care plan.  Depending on the condition, your plan could have included both over the counter or prescription medications. If there is a problem please reply  once you have received a response from your provider. Your safety is important to us .  If you have drug allergies check your prescription carefully.    You can use MyChart to ask questions about today's visit, request a non-urgent call back, or  ask for a work or school excuse for 24 hours related to this e-Visit. If it has been greater than 24 hours you will need to follow up with your provider, or enter a new e-Visit to address those concerns. You will get an e-mail in the next two days asking about your experience.  I hope that your e-visit has been  valuable and will speed your recovery. Thank you for using e-visits.   I have spent 5 minutes in review of e-visit questionnaire, review and updating patient chart, medical decision making and response to patient.   Delon CHRISTELLA Dickinson, PA-C
